# Patient Record
Sex: Male | Born: 1995 | Race: Black or African American | Hispanic: No | Marital: Single | Smoking: Never smoker
Health system: Southern US, Community
[De-identification: ages and names within clinical notes are randomized; demographics above are authoritative.]

## PROBLEM LIST (undated history)

## (undated) DIAGNOSIS — J45909 Unspecified asthma, uncomplicated: Secondary | ICD-10-CM

---
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
## 2021-01-09 ENCOUNTER — Other Ambulatory Visit: Payer: Self-pay | Admitting: Occupational Medicine

## 2021-01-09 ENCOUNTER — Ambulatory Visit: Payer: Self-pay

## 2021-01-09 ENCOUNTER — Other Ambulatory Visit: Payer: Self-pay

## 2021-01-09 DIAGNOSIS — M79672 Pain in left foot: Secondary | ICD-10-CM

## 2022-04-04 IMAGING — DX DG FOOT COMPLETE 3+V*L*
3 series · 3 of 3 positions shown · non-contrast
Comparison: None.

CLINICAL DATA: left foot pain

EXAM:
LEFT FOOT - COMPLETE 3+ VIEW

[foot ap]
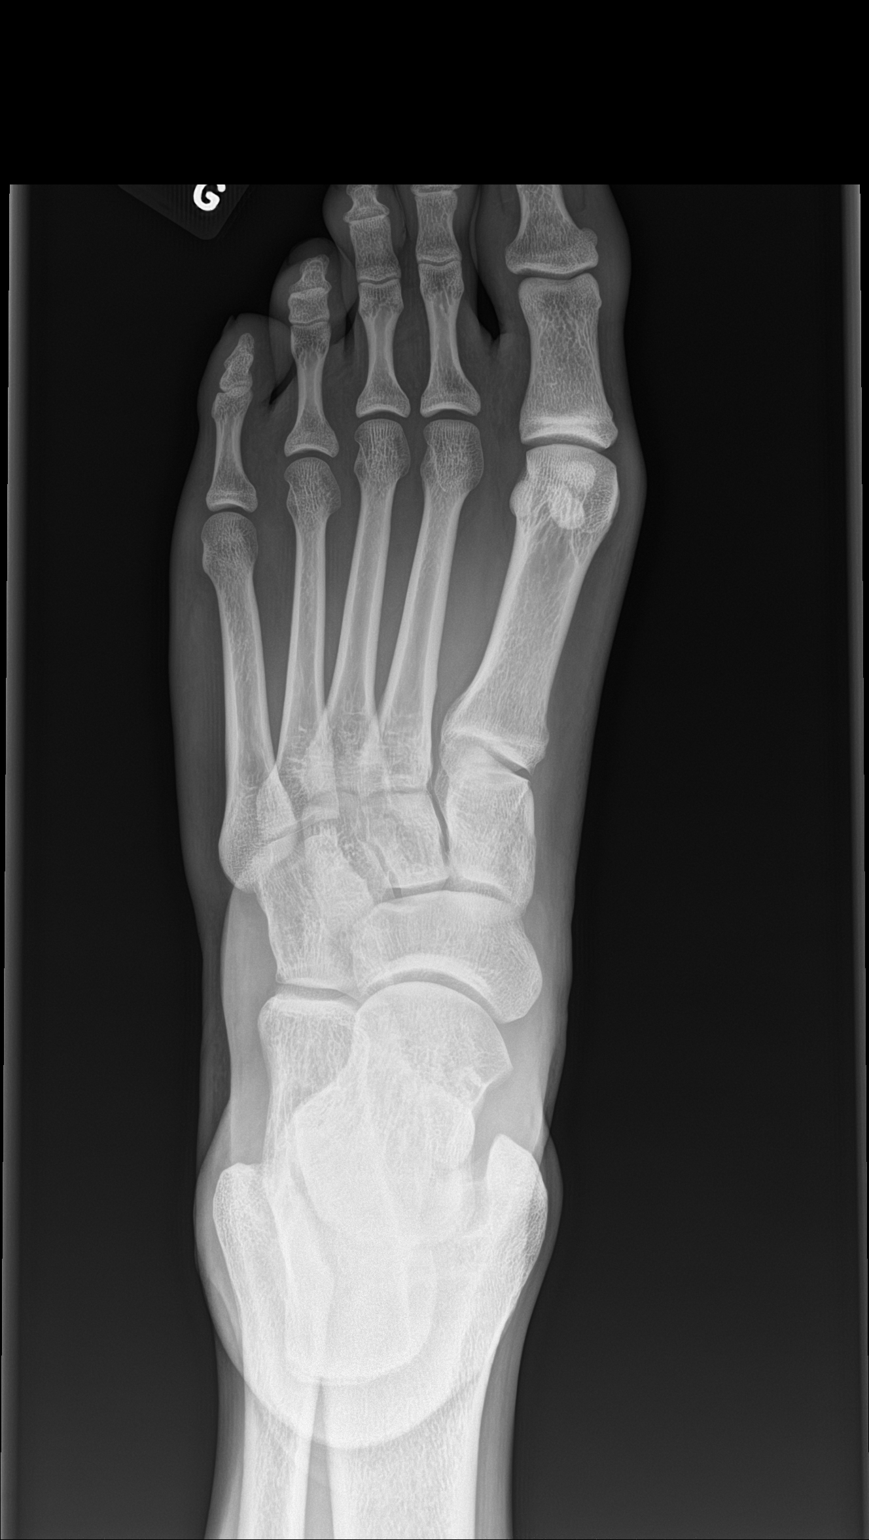

[foot obl]
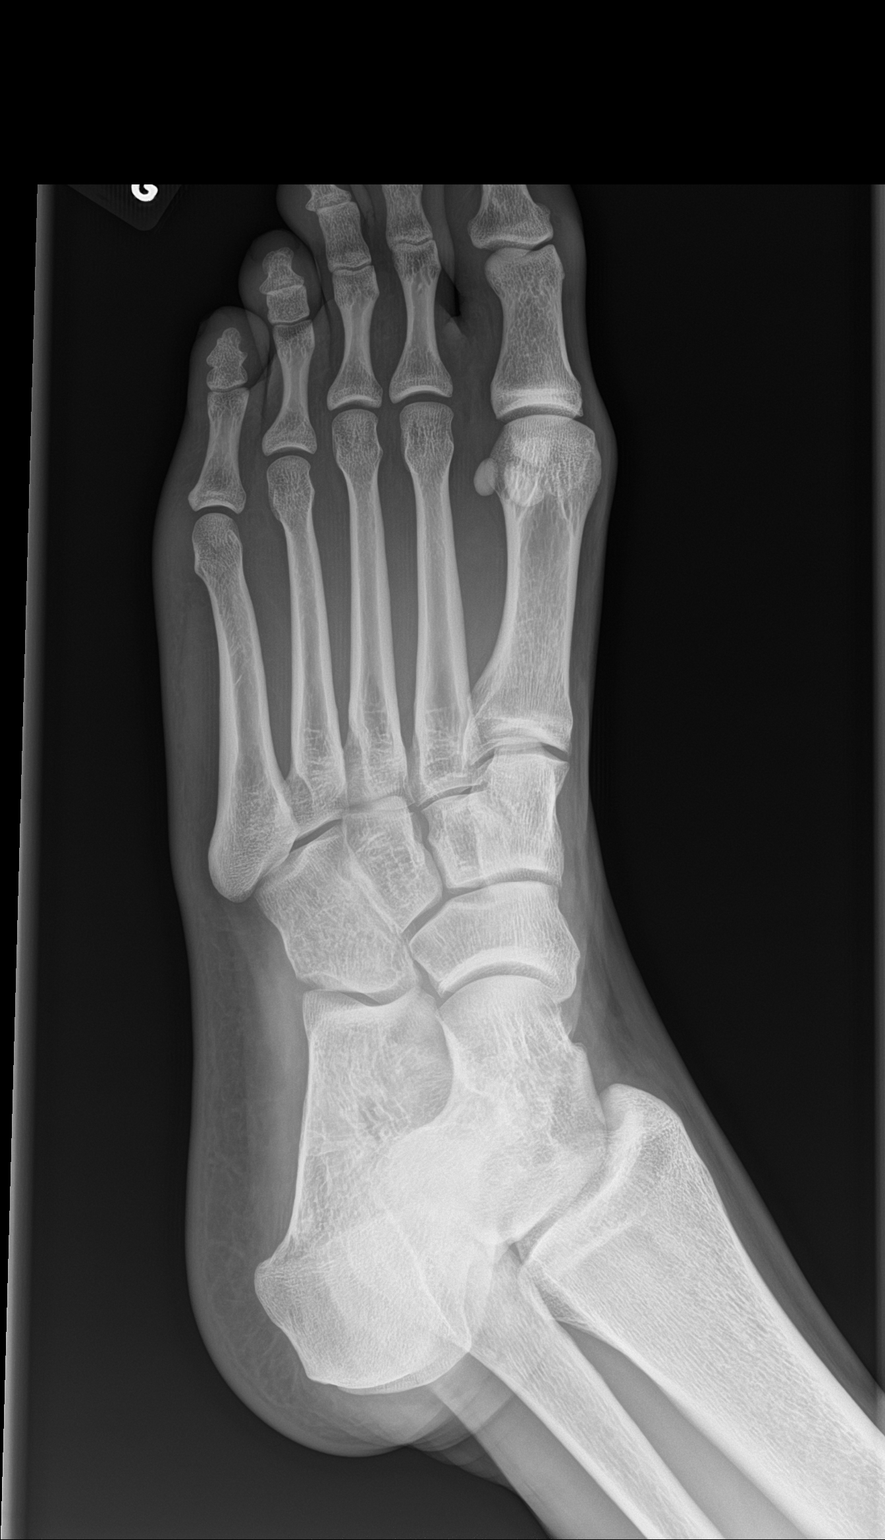

[foot lat]
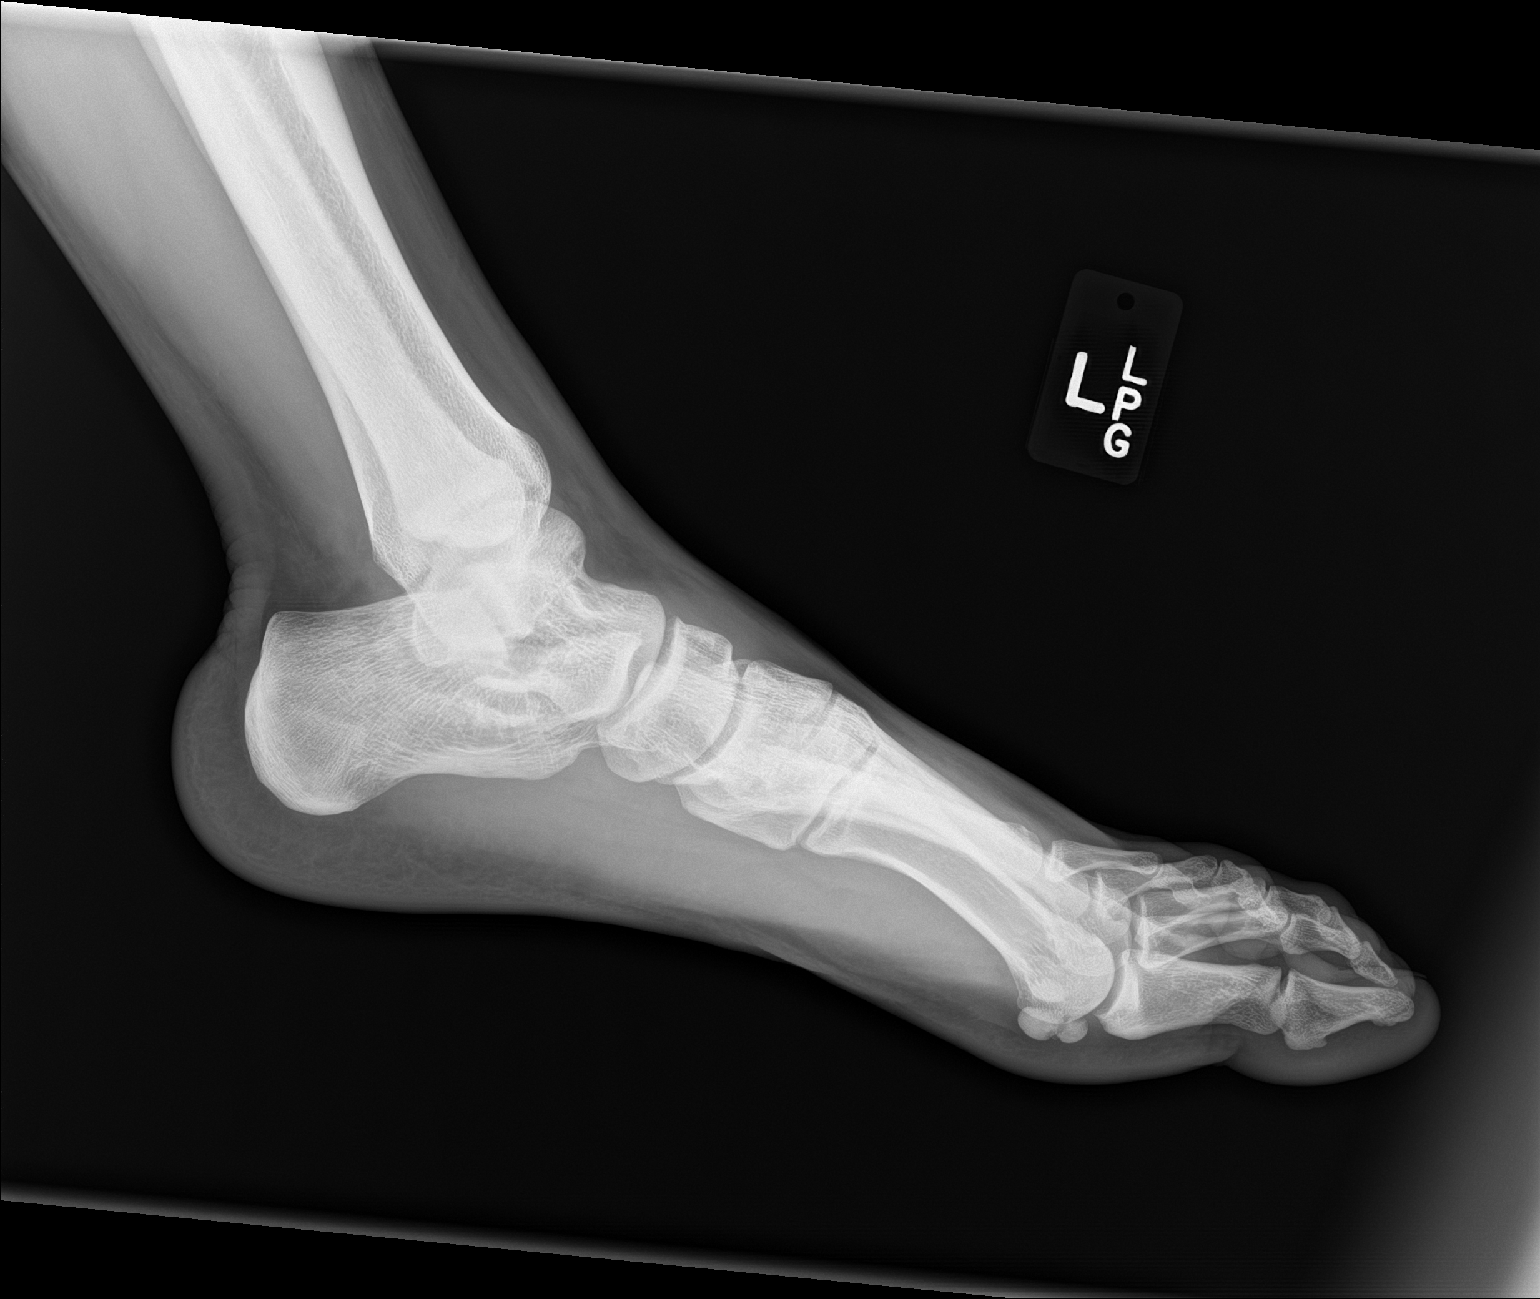

[3 of 3 positions shown; findings below may reference images not displayed]

FINDINGS: Normal alignment. No acute fracture. Normal mineralization. The soft
tissues are unremarkable. No joint effusion.
IMPRESSION: No malalignment or acute fracture.

## 2022-09-14 ENCOUNTER — Encounter (HOSPITAL_COMMUNITY): Payer: Self-pay

## 2022-09-14 ENCOUNTER — Emergency Department (HOSPITAL_COMMUNITY)
Admission: EM | Admit: 2022-09-14 | Discharge: 2022-09-14 | Disposition: A | Discharge status: Home / Self Care | Payer: Medicaid Other | Attending: Emergency Medicine | Admitting: Emergency Medicine

## 2022-09-14 DIAGNOSIS — D72829 Elevated white blood cell count, unspecified: Secondary | ICD-10-CM | POA: Insufficient documentation

## 2022-09-14 DIAGNOSIS — R1084 Generalized abdominal pain: Secondary | ICD-10-CM | POA: Diagnosis not present

## 2022-09-14 DIAGNOSIS — R112 Nausea with vomiting, unspecified: Secondary | ICD-10-CM | POA: Insufficient documentation

## 2022-09-14 DIAGNOSIS — J45909 Unspecified asthma, uncomplicated: Secondary | ICD-10-CM | POA: Insufficient documentation

## 2022-09-14 LAB — COMPREHENSIVE METABOLIC PANEL
ALT: 25 U/L (ref 0–44)
AST: 25 U/L (ref 15–41)
Albumin: 4.6 g/dL (ref 3.5–5.0)
Alkaline Phosphatase: 46 U/L (ref 38–126)
Anion gap: 10 (ref 5–15)
BUN: 11 mg/dL (ref 6–20)
CO2: 24 mmol/L (ref 22–32)
Calcium: 9.2 mg/dL (ref 8.9–10.3)
Chloride: 103 mmol/L (ref 98–111)
Creatinine, Ser: 0.78 mg/dL (ref 0.61–1.24)
GFR, Estimated: >60 mL/min (ref >60)
Glucose, Bld: 84 mg/dL (ref 70–99)
Potassium: 3.8 mmol/L (ref 3.5–5.1)
Sodium: 137 mmol/L (ref 135–145)
Total Bilirubin: 1.3 mg/dL — ABNORMAL HIGH (ref 0.3–1.2)
Total Protein: 7.8 g/dL (ref 6.5–8.1)

## 2022-09-14 LAB — LIPASE, BLOOD: Lipase: 31 U/L (ref 11–51)

## 2022-09-14 LAB — URINALYSIS, ROUTINE W REFLEX MICROSCOPIC
Bacteria, UA: NONE SEEN
Bilirubin Urine: NEGATIVE
Glucose, UA: NEGATIVE mg/dL
Hgb urine dipstick: NEGATIVE
Ketones, ur: NEGATIVE mg/dL
Leukocytes,Ua: NEGATIVE
Nitrite: NEGATIVE
Protein, ur: 30 mg/dL — AB
Specific Gravity, Urine: 1.016 (ref 1.005–1.030)
pH: 8 (ref 5.0–8.0)

## 2022-09-14 LAB — CBC
HCT: 37.3 % — ABNORMAL LOW (ref 39.0–52.0)
Hemoglobin: 11.7 g/dL — ABNORMAL LOW (ref 13.0–17.0)
MCH: 21 pg — ABNORMAL LOW (ref 26.0–34.0)
MCHC: 31.4 g/dL (ref 30.0–36.0)
MCV: 67.1 fL — ABNORMAL LOW (ref 80.0–100.0)
Platelets: 227 10*3/uL (ref 150–400)
RBC: 5.56 MIL/uL (ref 4.22–5.81)
RDW: 16.7 % — ABNORMAL HIGH (ref 11.5–15.5)
WBC: 12.2 10*3/uL — ABNORMAL HIGH (ref 4.0–10.5)
nRBC: 0.2 % (ref 0.0–0.2)

## 2022-09-14 MED ORDER — ONDANSETRON HCL 4 MG PO TABS: 4.0000 mg | ORAL_TABLET | Freq: Four times a day (QID) | ORAL | 0 refills | Status: AC

## 2022-09-14 MED ORDER — LACTATED RINGERS IV BOLUS
1000.0000 mL | Freq: Once | INTRAVENOUS | Status: AC
Start: 2022-09-14 — End: 2022-09-14
  Administered 2022-09-14: 1000 mL via INTRAVENOUS

## 2022-09-14 MED ORDER — ONDANSETRON HCL 4 MG/2ML IJ SOLN
4.0000 mg | Freq: Once | INTRAMUSCULAR | Status: AC
  Administered 2022-09-14: 4 mg via INTRAVENOUS
  Filled 2022-09-14: qty 2

## 2022-09-14 NOTE — ED Triage Notes (Addendum)
Pt arrives today c/o emesis since waking this morning and cannot keep food down. Pt states he drank last night. Denies diarrhea.

## 2022-09-14 NOTE — Discharge Instructions (Addendum)
You were seen in the ER for nausea and vomiting.  As we discussed, your lab work today was reassuring. I'm glad you feel improved after IV fluids and nausea medication. I've sent the same nausea medicine to your pharmacy.  I suspect your symptoms were likely related to a viral gastrointestinal infection. Try to stay well hydrated as best as you can. I'd recommend starting with a bland diet (toast, crackers), and work your way up from there as tolerated.  Continue to monitor how you're doing and return to the ER for new or worsening symptoms such as fever, worsening abdominal pain, persistent vomiting despite medication.   If you do not have a primary care provider, you may reach out to [facility] at [phone] to establish with one and make your first appointment.

## 2022-09-15 NOTE — ED Provider Notes (Signed)
[place] [facility] AT [facility] Provider Note   CSN: [id number] Arrival date & time: 09/14/22  1527     History  Chief Complaint  Patient presents with   Emesis    James Esparza is a 27 y.o. male with history of asthma who presents the emergency department complaining of vomiting since this morning.  Patient states that he drank alcohol last night, but it was around the same amount that he normally has.  Complains today of numerous episodes of vomiting, cannot keep any food down.  No diarrhea.  Little bit of abdominal discomfort.  No recent fever.   Emesis      Home Medications Prior to Admission medications   Medication Sig Start Date End Date Taking? Authorizing Provider  ondansetron (ZOFRAN) 4 MG tablet Take 1 tablet (4 mg total) by mouth every 6 (six) hours. 09/14/22  Yes Okey Zelek T, PA-C  albuterol (PROVENTIL HFA;VENTOLIN HFA) 108 (90 BASE) MCG/ACT inhaler Inhale 2 puffs into the lungs every 4 (four) hours as needed for wheezing or shortness of breath. 04/24/15   Tharon Aquas, PA  azithromycin (ZITHROMAX) 250 MG tablet Take 1-2 tablets by mouth daily. completed 08/16/15   [provider]  neomycin-polymyxin-hydrocortisone (CORTISPORIN) 3.5-10000-1 otic suspension Place 4 drops into the left ear 3 (three) times daily. 09/03/15   Antony Madura, PA-C  QVAR 80 MCG/ACT inhaler Inhale 2 puffs into the lungs 2 (two) times daily. 06/06/15   [provider]      Allergies    Patient has no known allergies.    Review of Systems   Review of Systems  Gastrointestinal:  Positive for nausea and vomiting.  All other systems reviewed and are negative.   Physical Exam Updated Vital Signs BP (!) 115/98   Pulse 82   Temp 99.1 F (37.3 C) (Oral)   Resp 18   SpO2 100%  Physical Exam Vitals and nursing note reviewed.  Constitutional:      Appearance: Normal appearance.  HENT:     Head: Normocephalic and atraumatic.  Eyes:      Conjunctiva/sclera: Conjunctivae normal.  Cardiovascular:     Rate and Rhythm: Normal rate and regular rhythm.  Pulmonary:     Effort: Pulmonary effort is normal. No respiratory distress.     Breath sounds: Normal breath sounds.  Abdominal:     General: There is no distension.     Palpations: Abdomen is soft.     Tenderness: There is generalized abdominal tenderness. There is no guarding.  Skin:    General: Skin is warm and dry.  Neurological:     General: No focal deficit present.     Mental Status: He is alert.     ED Results / Procedures / Treatments   Labs (all labs ordered are listed, but only abnormal results are displayed) Labs Reviewed  COMPREHENSIVE METABOLIC PANEL - Abnormal; Notable for the following components:      Result Value   Total Bilirubin 1.3 (*)    All other components within normal limits  CBC - Abnormal; Notable for the following components:   WBC 12.2 (*)    Hemoglobin 11.7 (*)    HCT 37.3 (*)    MCV 67.1 (*)    MCH 21.0 (*)    RDW 16.7 (*)    All other components within normal limits  URINALYSIS, ROUTINE W REFLEX MICROSCOPIC - Abnormal; Notable for the following components:   Protein, ur 30 (*)  All other components within normal limits  LIPASE, BLOOD    EKG None  Radiology No results found.  Procedures Procedures    Medications Ordered in ED Medications  lactated ringers bolus 1,000 mL (0 mLs Intravenous Stopped 09/14/22 1822)  ondansetron (ZOFRAN) injection 4 mg (4 mg Intravenous Given 09/14/22 1716)    ED Course/ Medical Decision Making/ A&P                             Medical Decision Making Amount and/or Complexity of Data Reviewed Labs: ordered.  Risk Prescription drug management.   This patient is a 27 y.o. male  who presents to the ED for concern of vomiting.   Differential diagnoses prior to evaluation: The emergent differential diagnosis includes, but is not limited to,  Boerhaave's, DKA, elevated ICP, Ischemic  bowel, Sepsis, Drug-related (toxicity, THC hyperemesis, ETOH, withdrawal), Appendicitis, Bowel obstruction, Electrolyte abnormalities, Pancreatitis, Biliary colic, Gastroenteritis, Gastroparesis, Hepatitis, Migraine, Thyroid disease, Renal colic, GERD/PUD, UTI. This is not an exhaustive differential.   Past Medical History / Co-morbidities: Asthma  Physical Exam: Physical exam performed. The pertinent findings include: Normal vital signs, no acute distress, afebrile.  Abdomen soft with generalized tenderness, no guarding.  Lab Tests/Imaging studies: I personally interpreted labs/imaging and the pertinent results include: Mild leukocytosis of 12.2, hemoglobin 11.7, no prior to compare to.  CMP unremarkable.  Normal lipase.  Urinalysis negative for hematuria or infection.  As patient has a benign abdominal exam and reassuring laboratory evaluation, will defer CT imaging of the abdomen at this time.  Medications: I ordered medication including IV fluids and Zofran.  I have reviewed the patients home medicines and have made adjustments as needed.  Upon reevaluation patient states that symptoms have significantly improved, no episodes of emesis while in the ER.   Disposition: After consideration of the diagnostic results and the patients response to treatment, I feel that emergency department workup does not suggest an emergent condition requiring admission or immediate intervention beyond what has been performed at this time. The plan is: Charged home with symptomatic management of likely viral enteritis.  Not toxic appearing, tolerating p.o.  Lab work reassuring.  Will discharge with prescription for Zofran and encourage bland diet.. The patient is safe for discharge and has been instructed to return immediately for worsening symptoms, change in symptoms or any other concerns.  Final Clinical Impression(s) / ED Diagnoses Final diagnoses:  Nausea and vomiting, unspecified vomiting type    Rx / DC  Orders ED Discharge Orders          Ordered    ondansetron (ZOFRAN) 4 MG tablet  Every 6 hours        09/14/22 1931           Portions of this report may have been transcribed using voice recognition software. Every effort was made to ensure accuracy; however, inadvertent computerized transcription errors may be present.    Jeanella Flattery 09/15/22 1610    Pricilla Loveless, MD 09/19/22 2114

## 2023-11-01 ENCOUNTER — Emergency Department (HOSPITAL_COMMUNITY)
Admission: EM | Admit: 2023-11-01 | Discharge: 2023-11-01 | Disposition: A | Discharge status: Home / Self Care | Attending: Emergency Medicine | Admitting: Emergency Medicine

## 2023-11-01 ENCOUNTER — Encounter (HOSPITAL_COMMUNITY): Payer: Self-pay

## 2023-11-01 ENCOUNTER — Other Ambulatory Visit: Payer: Self-pay

## 2023-11-01 ENCOUNTER — Emergency Department (HOSPITAL_COMMUNITY)

## 2023-11-01 DIAGNOSIS — T148XXA Other injury of unspecified body region, initial encounter: Secondary | ICD-10-CM

## 2023-11-01 DIAGNOSIS — Z23 Encounter for immunization: Secondary | ICD-10-CM | POA: Insufficient documentation

## 2023-11-01 DIAGNOSIS — S66901A Unspecified injury of unspecified muscle, fascia and tendon at wrist and hand level, right hand, initial encounter: Secondary | ICD-10-CM

## 2023-11-01 DIAGNOSIS — S51811A Laceration without foreign body of right forearm, initial encounter: Secondary | ICD-10-CM | POA: Insufficient documentation

## 2023-11-01 MED ORDER — LIDOCAINE-EPINEPHRINE (PF) 2 %-1:200000 IJ SOLN
10.0000 mL | Freq: Once | INTRAMUSCULAR | Status: AC
  Administered 2023-11-01: 10 mL
  Filled 2023-11-01: qty 20

## 2023-11-01 MED ORDER — TETANUS-DIPHTH-ACELL PERTUSSIS 5-2.5-18.5 LF-MCG/0.5 IM SUSY
0.5000 mL | PREFILLED_SYRINGE | Freq: Once | INTRAMUSCULAR | Status: AC
  Administered 2023-11-01: 0.5 mL via INTRAMUSCULAR
  Filled 2023-11-01: qty 0.5

## 2023-11-01 MED ORDER — CEFAZOLIN SODIUM-DEXTROSE 1-4 GM/50ML-% IV SOLN
1.0000 g | Freq: Once | INTRAVENOUS | Status: AC
  Administered 2023-11-01: 1 g via INTRAVENOUS
  Filled 2023-11-01: qty 50

## 2023-11-01 MED ORDER — FENTANYL CITRATE PF 50 MCG/ML IJ SOSY
50.0000 ug | PREFILLED_SYRINGE | Freq: Once | INTRAMUSCULAR | Status: AC
  Administered 2023-11-01: 50 ug via INTRAVENOUS
  Filled 2023-11-01: qty 1
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
## 2023-11-01 NOTE — Progress Notes (Signed)
 Orthopedic Tech Progress Note Patient Details:  James Esparza 1995/06/21 [id number]  Ortho Devices Type of Ortho Device: Volar splint, Arm sling Ortho Device/Splint Location: rue Ortho Device/Splint Interventions: Ordered, Application, Adjustment  I applied the splint as requested by the dr. Rochell Chroman Interventions Patient Tolerated: Well Instructions Provided: Care of device, Adjustment of device  [name] 11/01/2023, 11:41 PM

## 2023-11-01 NOTE — ED Notes (Signed)
 Ortho tech contacted for splint

## 2023-11-01 NOTE — Discharge Instructions (Addendum)
 Overall I suspect you suffered an extensor tendon injury to your right hand as we discussed.  Please call hand surgeon tomorrow, Dr. Delmar Ferrara for follow-up appointment.  They will reexamine your injury and do further repairs.  Continue Tylenol and ibuprofen for pain.  Please keep your splint clean and dry.
